# Patient Record
Sex: Female | Born: 1950 | Race: Black or African American | Hispanic: No | Marital: Married
Health system: Southern US, Community
[De-identification: ages and names within clinical notes are randomized; demographics above are authoritative.]

---

## 2020-11-05 ENCOUNTER — Emergency Department (HOSPITAL_BASED_OUTPATIENT_CLINIC_OR_DEPARTMENT_OTHER): Payer: Medicaid Other

## 2020-11-05 ENCOUNTER — Emergency Department (HOSPITAL_BASED_OUTPATIENT_CLINIC_OR_DEPARTMENT_OTHER)
Admission: EM | Admit: 2020-11-05 | Discharge: 2020-11-05 | Disposition: A | Discharge status: Home / Self Care | Payer: Medicaid Other | Attending: Emergency Medicine | Admitting: Emergency Medicine

## 2020-11-05 ENCOUNTER — Other Ambulatory Visit: Payer: Self-pay

## 2020-11-05 DIAGNOSIS — W19XXXA Unspecified fall, initial encounter: Secondary | ICD-10-CM

## 2020-11-05 DIAGNOSIS — S43014A Anterior dislocation of right humerus, initial encounter: Secondary | ICD-10-CM

## 2020-11-05 DIAGNOSIS — W108XXA Fall (on) (from) other stairs and steps, initial encounter: Secondary | ICD-10-CM | POA: Insufficient documentation

## 2020-11-05 DIAGNOSIS — S4991XA Unspecified injury of right shoulder and upper arm, initial encounter: Secondary | ICD-10-CM | POA: Diagnosis present

## 2020-11-05 MED ORDER — ACETAMINOPHEN 325 MG PO TABS
650.0000 mg | ORAL_TABLET | Freq: Once | ORAL | Status: AC
  Administered 2020-11-05: 650 mg via ORAL
  Filled 2020-11-05: qty 2

## 2020-11-05 MED ORDER — LIDOCAINE HCL (PF) 1 % IJ SOLN
20.0000 mL | Freq: Once | INTRAMUSCULAR | Status: AC
  Administered 2020-11-05: 20 mL
  Filled 2020-11-05: qty 20

## 2020-11-05 MED ORDER — LORAZEPAM 2 MG/ML IJ SOLN
1.0000 mg | Freq: Once | INTRAMUSCULAR | Status: AC
  Administered 2020-11-05: 1 mg via INTRAVENOUS
  Filled 2020-11-05: qty 1

## 2020-11-05 MED ORDER — FENTANYL CITRATE (PF) 100 MCG/2ML IJ SOLN
50.0000 ug | INTRAMUSCULAR | Status: DC | PRN
  Administered 2020-11-05: 50 ug via INTRAVENOUS
  Filled 2020-11-05: qty 2

## 2020-11-05 NOTE — Discharge Instructions (Addendum)
Your right shoulder dislocated but is now back in place.  Please follow-up with your primary care doctor as well as an orthopedic doctor.  Please wear the sling until you are seen by the orthopedic doctor.  Please refrain from any heavy overhead activity.  Drink plenty of water and use Tylenol and ibuprofen for pain.  Please use Tylenol or ibuprofen for pain.  You may use 600 mg ibuprofen every 6 hours or 1000 mg of Tylenol every 6 hours.  You may choose to alternate between the 2.  This would be most effective.  Not to exceed 4 g of Tylenol within 24 hours.  Not to exceed 3200 mg ibuprofen 24 hours.

## 2020-11-05 NOTE — ED Triage Notes (Signed)
Pt from [employer] and fell going down the steps at approximately 1315 today tripped on carpet and broke her fall with her right hand. No LOC, No thinners, Pt did not hit her head. Pt did go to the ground but  not fall down the stairs.

## 2020-11-05 NOTE — ED Provider Notes (Signed)
[facility] EMERGENCY DEPARTMENT Provider Note   CSN: [id number] Arrival date & time: 11/05/20  1410     History Chief Complaint  Patient presents with  . Fall  . Arm Injury    Kelli Long is a 70 y.o. female.  HPI Patient is a 70 year old female Presented today with right arm pain after she fell and tumbled down several steps at approximately 1:15 PM today.  She states that she tripped on the carpet that was on the stairs.  She states that she did not experience any symptoms prior to the fall such as shortness of breath, lightheadedness, chest pain, abdominal pain, headache.  She states that she did not hit her head or lose consciousness.  She denies any blood thinner use.  No neck pain.  She states that the only pain that she has is in her right arm.  Between the elbow and the shoulder.  She states an achy pain in her shoulder which is mild.  She states that she came into the ER for evaluation because she was concerned for a fracture.  No other associate symptoms.  No nausea, vomiting, headache, chest pain, shortness breath, abdominal pain, lightheadedness or dizziness.  Pain in the arm is worse with movement and flexing the arm at the elbow.  She has taken no medications prior to arrival for pain.    No past medical history on file.  There are no problems to display for this patient.    The histories are not reviewed yet. Please review them in the "History" navigator section and refresh this SmartLink.   OB History   No obstetric history on file.     No family history on file.     Home Medications Prior to Admission medications   Not on File    Allergies    Patient has no allergy information on record.  Review of Systems   Review of Systems  Constitutional: Negative for fever.  HENT: Negative for congestion.   Respiratory: Negative for shortness of breath.   Cardiovascular: Negative for chest pain.  Gastrointestinal: Negative for abdominal  distention.  Musculoskeletal:       Right arm pain, right shoulder pain  Neurological: Negative for dizziness and headaches.    Physical Exam Updated Vital Signs BP (!) 120/50   Pulse 74   Temp 98 F (36.7 C) (Oral)   Resp (!) 25   Ht 5\' 6"  (1.676 m)   Wt 97.5 kg   SpO2 100%   BMI 34.70 kg/m   Physical Exam Vitals and nursing note reviewed.  Constitutional:      General: She is not in acute distress.    Appearance: Normal appearance. She is not ill-appearing.  HENT:     Head: Normocephalic and atraumatic.  Eyes:     General: No scleral icterus.       Right eye: No discharge.        Left eye: No discharge.     Conjunctiva/sclera: Conjunctivae normal.  Pulmonary:     Effort: Pulmonary effort is normal.     Breath sounds: No stridor.  Abdominal:     Tenderness: There is no abdominal tenderness.     Comments: Abdomen chest without bruising or contusion or tenderness  Musculoskeletal:     Comments: Tenderness to palpation of the lateral epicondyle of the right elbow which is mild and nonfocal.  She also has tenderness palpation of the humerus at the midshaft.  Some diffuse right shoulder tenderness to  palpation at the deltoid no scapular tenderness.  No C, T, L-spine tenderness to palpation.  No other bony tenderness over joints or Long bones of the upper and lower extremities.     No neck or back midline tenderness, step-off, deformity, or bruising. Able to turn head left and right 45 degrees without difficulty.  Full range of motion of upper and lower extremity joints shown after palpation was conducted; with 5/5 symmetrical strength in upper and lower extremities. No chest wall tenderness, no facial or cranial tenderness.   Patient has intact sensation grossly in lower and upper extremities. Intact patellar and ankle reflexes. Patient able to ambulate without difficulty.  Radial and DP pulses palpated BL.   Skin:    General: Skin is warm and dry.  Neurological:      Mental Status: She is alert and oriented to person, place, and time. Mental status is at baseline.     Comments: Sensation intact in all 4 extremities, AOx3, able answer questions appropriate follow commands.  Psychiatric:        Mood and Affect: Mood normal.        Behavior: Behavior normal.     ED Results / Procedures / Treatments   Labs (all labs ordered are listed, but only abnormal results are displayed) Labs Reviewed - No data to display  EKG None  Radiology DG Shoulder Right  Result Date: 11/05/2020 CLINICAL DATA:  Post reduction for anterior dislocation EXAM: RIGHT SHOULDER - 2+ VIEW COMPARISON:  November 05, 2020 study obtained earlier in the day FINDINGS: Frontal and Y scapular images were obtained. The previously noted subcoracoid anterior dislocation has been reduced. No fracture or dislocation evident currently. There is moderate generalized joint space narrowing. There is bony overgrowth along the inferior clavicle laterally and inferior acromion. No erosion. Visualized right lung clear. IMPRESSION: Currently no fracture or dislocation. 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 Bony overgrowth along the inferior acromion and inferolateral clavicle potentially places patient at increased risk for impingement syndrome. Electronically Signed   By: Bretta Bang III M.D.   On: 11/05/2020 18:26   DG Shoulder Right  Result Date: 11/05/2020 CLINICAL DATA:  Pain following fall EXAM: RIGHT SHOULDER - 2+ VIEW COMPARISON:  None. FINDINGS: Oblique and Y scapular images were obtained. There is a subcoracoid anterior shoulder dislocation. The humeral head abuts the inferior glenoid. No fracture evident. No appreciable joint space narrowing or erosion. Visualized right lung clear. IMPRESSION: Subcoracoid anterior shoulder dislocation.  No fracture evident. Electronically Signed   By: Bretta Bang III M.D.   On: 11/05/2020 16:21   DG Humerus Right  Result Date:  11/05/2020 CLINICAL DATA:  Pain following fall EXAM: RIGHT HUMERUS - 2+ VIEW COMPARISON:  None. FINDINGS: Frontal and lateral views were obtained. There is no acute fracture. There is a subcoracoid anterior shoulder dislocation. No elbow joint dislocation. No abnormal periosteal reaction. IMPRESSION: No fracture.  Subcoracoid anterior shoulder dislocation. Electronically Signed   By: Bretta Bang III M.D.   On: 11/05/2020 16:20    Procedures Reduction of dislocation  Date/Time: 11/05/2020 8:04 PM Performed by: Gailen Shelter, PA Authorized by: Gailen Shelter, PA  Consent: Verbal consent obtained. Risks and benefits: risks, benefits and alternatives were discussed Consent given by: patient Patient understanding: patient states understanding of the procedure being performed Patient consent: the patient's understanding of the procedure matches consent given Relevant documents: relevant documents present and verified Test results: test results available and properly labeled Imaging studies:  imaging studies available Patient identity confirmed: verbally with patient and arm band Local anesthesia used: no  Anesthesia: Local anesthesia used: no  Sedation: Patient sedated: no  Patient tolerance: patient tolerated the procedure well with no immediate complications Comments: Patient is right shoulder was anteriorly dislocated.  This was reduced using FA RES technique. Lidocaine was used intra-articularly in the right shoulder.  Fentanyl and Ativan were given for muscle relaxation and for pain control.  Injection of joint  Date/Time: 11/05/2020 8:05 PM Performed by: Gailen Shelter, PA Authorized by: Gailen Shelter, PA  Consent: Verbal consent obtained. Risks and benefits: risks, benefits and alternatives were discussed Consent given by: patient Patient understanding: patient states understanding of the procedure being performed Patient consent: the patient's understanding of  the procedure matches consent given Relevant documents: relevant documents present and verified Test results: test results available and properly labeled Imaging studies: imaging studies available Patient identity confirmed: verbally with patient and arm band Local anesthesia used: no  Anesthesia: Local anesthesia used: no  Sedation: Patient sedated: no  Patient tolerance: patient tolerated the procedure well with no immediate complications Comments: Sterile procedure was used to inject 20 cc of lidocaine that was plain into the right shoulder joint this was done by a posterior approach.  Anatomical landmarks were palpated and site was marked with skin indentation.  Patient tolerated procedure well.  Reduction was conducted and was confirmed with follow-up x-ray.  No procedural sedation was needed.      Medications Ordered in ED Medications  fentaNYL (SUBLIMAZE) injection 50 mcg (50 mcg Intravenous Given 11/05/20 1726)  acetaminophen (TYLENOL) tablet 650 mg (650 mg Oral Given 11/05/20 1543)  LORazepam (ATIVAN) injection 1 mg (1 mg Intravenous Given 11/05/20 1651)  lidocaine (PF) (XYLOCAINE) 1 % injection 20 mL (20 mLs Infiltration Given 11/05/20 1715)    ED Course  I have reviewed the triage vital signs and the nursing notes.  Pertinent labs & imaging results that were available during my care of the patient were reviewed by me and considered in my medical decision making (see chart for details).    MDM Rules/Calculators/A&P                          Patient is 70 year old female with no past medical history is apart from CPAP use due to OSA presented today with right shoulder/humeral pain.  She did have a fall on steps today.  No head injury or loss of consciousness she is on blood thinners and trauma examination without any pain with palpation anywhere other than the right shoulder and mid humerus.  No significant deformity of the shoulder although body habitus makes this difficult  to evaluate thoroughly.  X-ray of humerus which shows elbow-discussed with x-ray tech-shows no fracture.  Right shoulder appears to have anterior dislocation.  Right anterior shoulder was re-located.  This was reduced with FA RES technique.  20 cc of plain lidocaine were injected intra-articularly of the right shoulder using a posterior approach.  Reduction was successful.  Post procedural x-rays confirmed relocation of right shoulder.  X-rays were negative for any fracture.  Patient is placed in arm sling.  She will follow up with orthopedic when she returns to [place] where she lives.  She was given orthopedic follow-up recommendations here in [place] should she need to follow-up sooner.  She was also given return precautions.  She states her pain is completely resolved at this time.  All questions answered  best my ability.  Patient ambulatory at time of discharge.  Final Clinical Impression(s) / ED Diagnoses Final diagnoses:  Fall  Anterior dislocation of right shoulder, initial encounter    Rx / DC Orders ED Discharge Orders    None       Gailen Shelter, [place] 11/05/20 2017    Milagros Loll, MD 11/06/20 [phone]

## 2021-08-09 IMAGING — DX DG HUMERUS 2V *R*
2 series · 2 of 2 positions shown · non-contrast
Comparison: None.

CLINICAL DATA: Pain following fall

EXAM:
RIGHT HUMERUS - 2+ VIEW

[humerus ap]
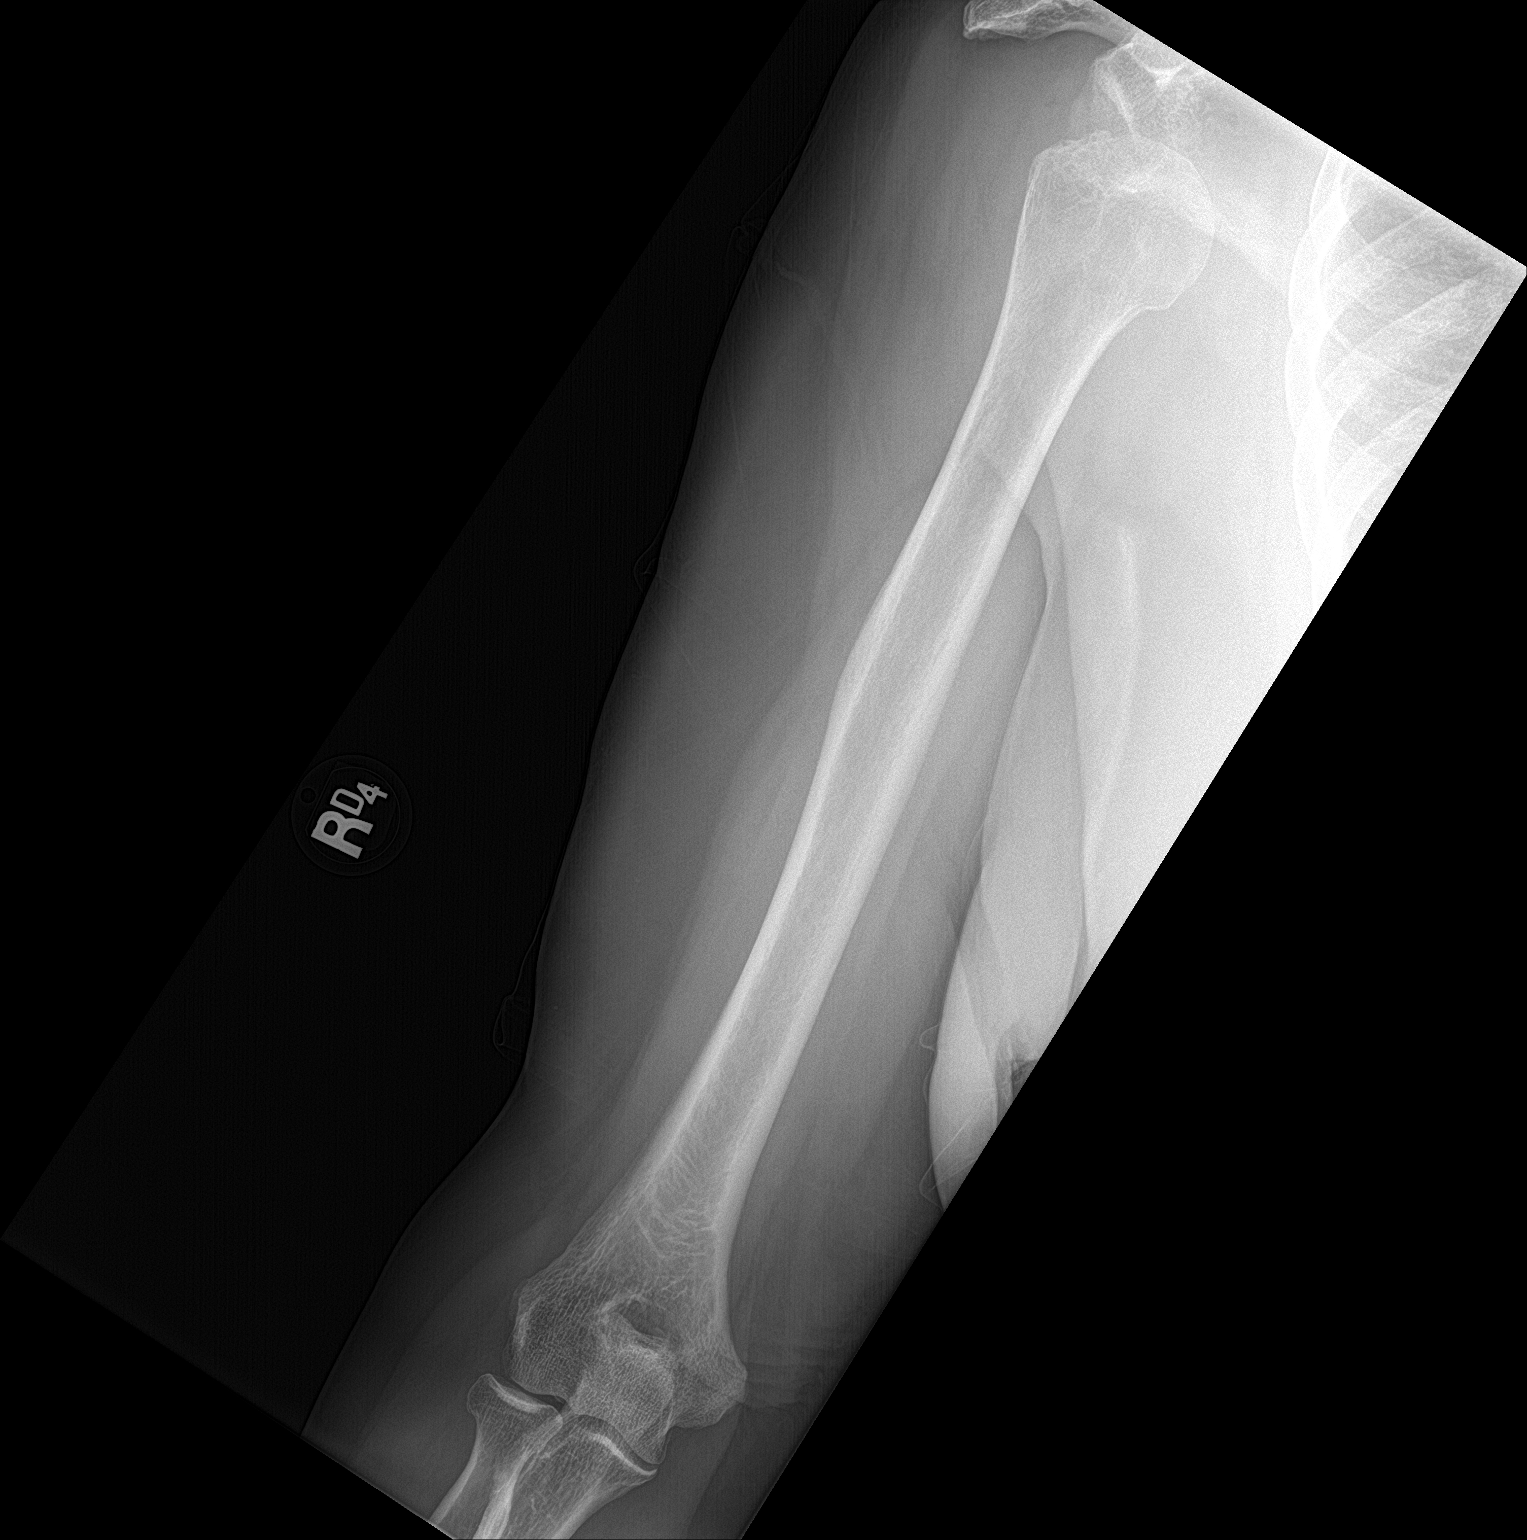

[humerus lat]
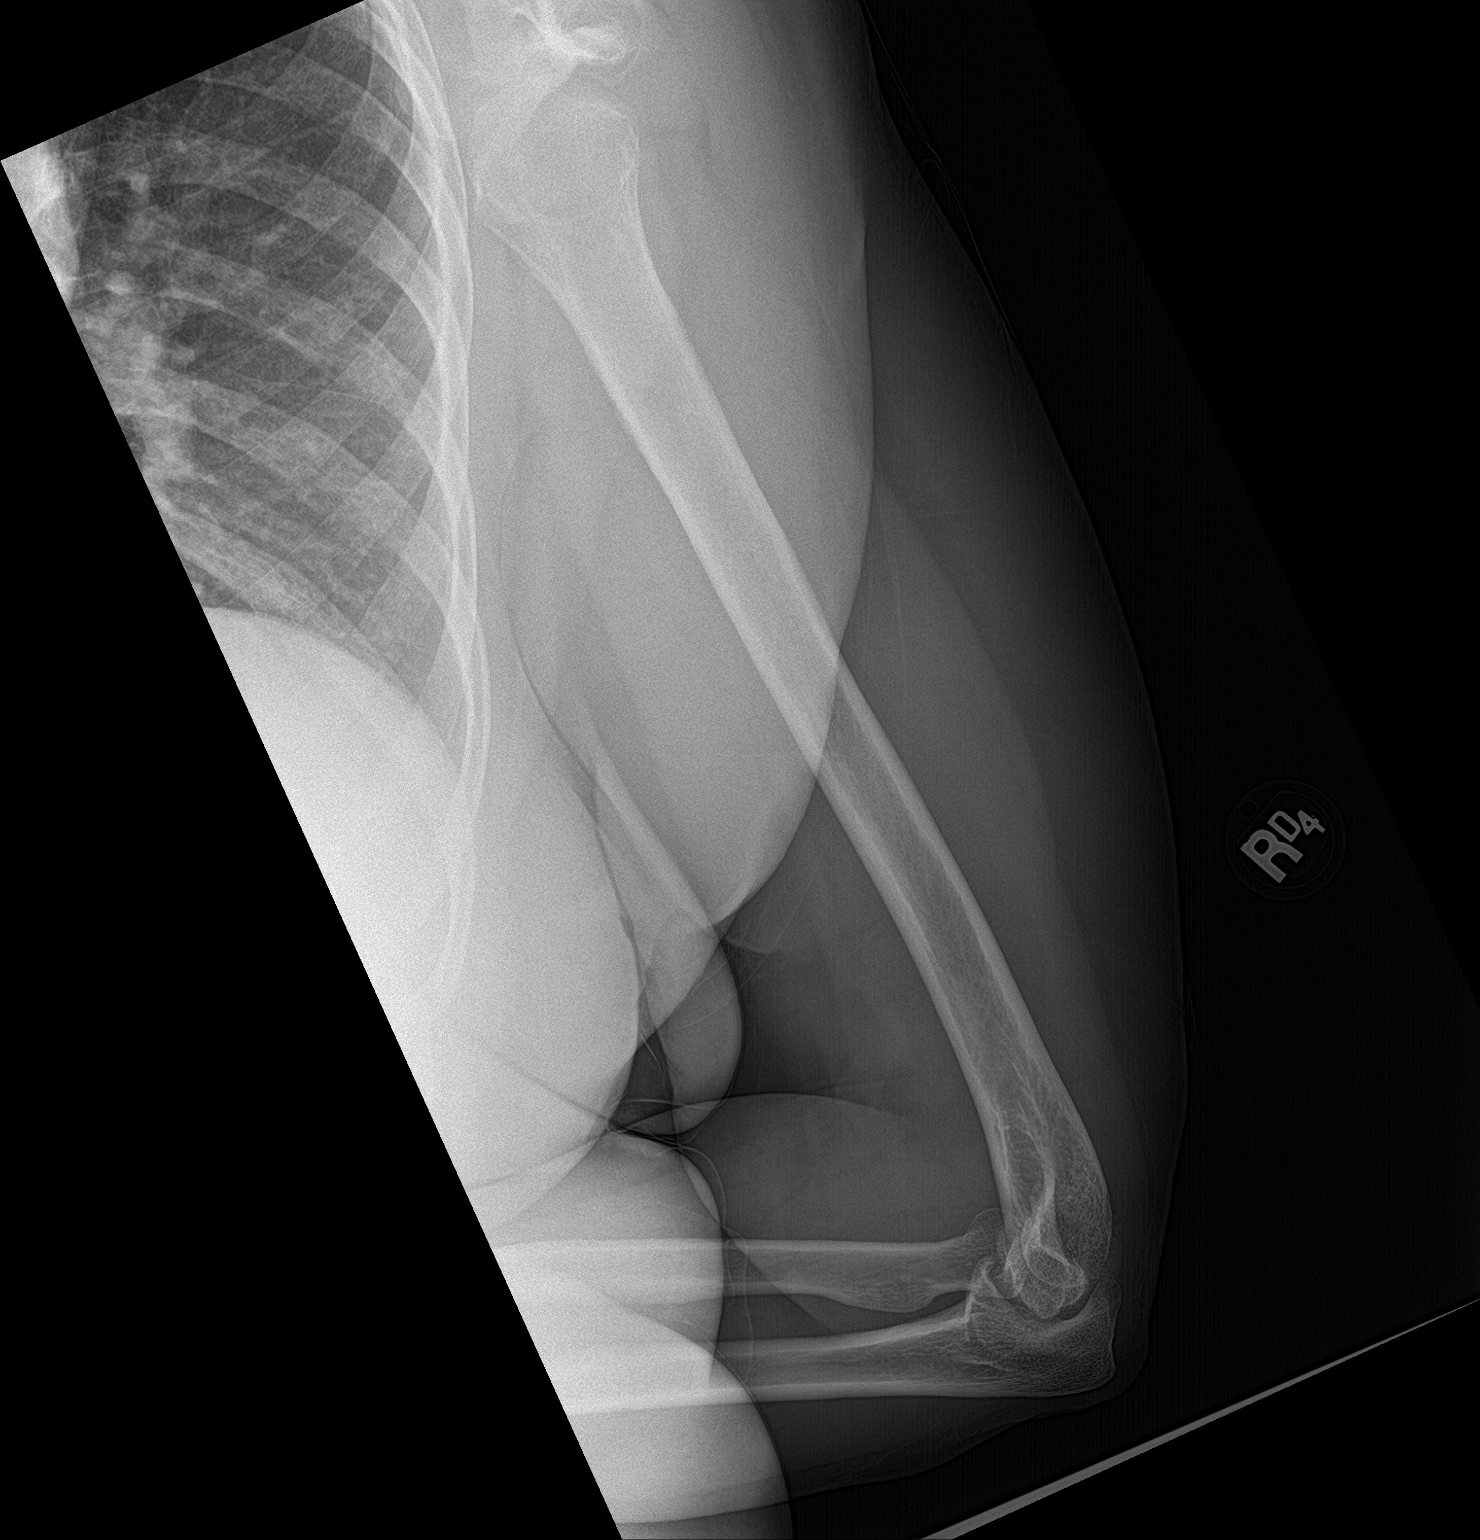

[2 of 2 positions shown; findings below may reference images not displayed]

FINDINGS: Frontal and lateral views were obtained. There is no acute fracture.
There is a subcoracoid anterior shoulder dislocation. No elbow joint
dislocation. No abnormal periosteal reaction.
IMPRESSION: No fracture.  Subcoracoid anterior shoulder dislocation.
